# Patient Record
Sex: Female | Born: 1990 | Race: Black or African American | Hispanic: No | Marital: Single | Smoking: Never smoker
Health system: Southern US, Community
[De-identification: ages and names within clinical notes are randomized; demographics above are authoritative.]

---

## 2010-03-31 ENCOUNTER — Encounter: Admission: RE | Admit: 2010-03-31 | Discharge: 2010-03-31 | Payer: Self-pay | Admitting: Internal Medicine

## 2011-10-23 ENCOUNTER — Emergency Department (HOSPITAL_COMMUNITY)
Admission: EM | Admit: 2011-10-23 | Discharge: 2011-10-24 | Disposition: A | Discharge status: Home / Self Care | Attending: Emergency Medicine | Admitting: Emergency Medicine

## 2011-10-23 ENCOUNTER — Encounter (HOSPITAL_COMMUNITY): Payer: Self-pay | Admitting: *Deleted

## 2011-10-23 DIAGNOSIS — N390 Urinary tract infection, site not specified: Secondary | ICD-10-CM

## 2011-10-23 DIAGNOSIS — N72 Inflammatory disease of cervix uteri: Secondary | ICD-10-CM | POA: Insufficient documentation

## 2011-10-23 DIAGNOSIS — R300 Dysuria: Secondary | ICD-10-CM | POA: Insufficient documentation

## 2011-10-23 DIAGNOSIS — R350 Frequency of micturition: Secondary | ICD-10-CM | POA: Insufficient documentation

## 2011-10-23 DIAGNOSIS — N898 Other specified noninflammatory disorders of vagina: Secondary | ICD-10-CM | POA: Insufficient documentation

## 2011-10-23 LAB — POCT PREGNANCY, URINE: Preg Test, Ur: NEGATIVE

## 2011-10-23 NOTE — ED Notes (Signed)
She has had painful urination and some blood in her urine for the past 2 hours.  lmp  2months

## 2011-10-24 LAB — WET PREP, GENITAL
Clue Cells Wet Prep HPF POC: NONE SEEN
Trich, Wet Prep: NONE SEEN
Yeast Wet Prep HPF POC: NONE SEEN

## 2011-10-24 LAB — URINALYSIS, ROUTINE W REFLEX MICROSCOPIC
Bilirubin Urine: NEGATIVE
Protein, ur: NEGATIVE mg/dL
Urobilinogen, UA: 0.2 mg/dL (ref 0.0–1.0)
pH: 7 (ref 5.0–8.0)

## 2011-10-24 LAB — URINE MICROSCOPIC-ADD ON

## 2011-10-24 MED ORDER — PHENAZOPYRIDINE HCL 200 MG PO TABS: 200.0000 mg | ORAL_TABLET | Freq: Three times a day (TID) | ORAL | Status: AC

## 2011-10-24 MED ORDER — LIDOCAINE HCL (PF) 1 % IJ SOLN
INTRAMUSCULAR | Status: AC
  Administered 2011-10-24: 03:00:00
  Filled 2011-10-24: qty 5

## 2011-10-24 MED ORDER — AZITHROMYCIN 250 MG PO TABS
1000.0000 mg | ORAL_TABLET | Freq: Once | ORAL | Status: AC
  Administered 2011-10-24: 1000 mg via ORAL
  Filled 2011-10-24: qty 4

## 2011-10-24 MED ORDER — CEFTRIAXONE SODIUM 250 MG IJ SOLR
250.0000 mg | Freq: Once | INTRAMUSCULAR | Status: AC
  Administered 2011-10-24: 250 mg via INTRAMUSCULAR
  Filled 2011-10-24: qty 250

## 2011-10-24 MED ORDER — NITROFURANTOIN MONOHYD MACRO 100 MG PO CAPS: 100.0000 mg | ORAL_CAPSULE | Freq: Two times a day (BID) | ORAL | Status: AC

## 2011-10-24 NOTE — ED Provider Notes (Signed)
Medical screening examination/treatment/procedure(s) were performed by non-physician practitioner and as supervising physician I was immediately available for consultation/collaboration.   Joya Gaskins, MD 10/24/11 [phone]

## 2011-10-24 NOTE — ED Provider Notes (Signed)
History     CSN: [id number]  Arrival date & time 10/23/11  2306   First MD Initiated Contact with Patient 10/24/11 0116      Chief Complaint  Patient presents with  . poss uti      Patient is a 21 y.o. female presenting with dysuria.  Dysuria  This is a new problem. The current episode started 3 to 5 hours ago. The problem occurs every urination. The problem has been gradually worsening. The quality of the pain is described as burning. The pain is at a severity of 8/10. There has been no fever. She is sexually active. There is no history of pyelonephritis. Associated symptoms include frequency. Pertinent negatives include no chills, no nausea, no vomiting, no discharge and no flank pain. She has tried nothing for the symptoms. Her past medical history does not include kidney stones.   patient portable onset of dysuria and frequency of urination approximately 4 hours ago. States she also saw a spot of blood on the tissue after a void. Patient denies recent unprotected intercourse, fever, vaginal discharge, lower abd pain, flank pain or other associated symptoms. Patient states her LMP was one half months ago. She was taking OCs but is not at this time.  History reviewed. No pertinent past medical history.  History reviewed. No pertinent past surgical history.  History reviewed. No pertinent family history.  History  Substance Use Topics  . Smoking status: Never Smoker   . Smokeless tobacco: Not on file  . Alcohol Use: Yes    OB History    Grav Para Term Preterm Abortions TAB SAB Ect Mult Living                  Review of Systems  Constitutional: Negative.  Negative for chills.  HENT: Negative.   Eyes: Negative.   Respiratory: Negative.   Cardiovascular: Negative.   Gastrointestinal: Negative.  Negative for nausea and vomiting.  Genitourinary: Positive for dysuria and frequency. Negative for flank pain.  Musculoskeletal: Negative.   Skin: Negative.   Neurological:  Negative.   Hematological: Negative.   Psychiatric/Behavioral: Negative.     Allergies  Review of patient's allergies indicates no known allergies.  Home Medications   Current Outpatient Rx  Name Route Sig Dispense Refill  . BIOTIN PO Oral Take 1 tablet by mouth 3 (three) times daily.    Marland Kitchen NITROFURANTOIN MONOHYD MACRO 100 MG PO CAPS Oral Take 1 capsule (100 mg total) by mouth 2 (two) times daily. 14 capsule 0  . PHENAZOPYRIDINE HCL 200 MG PO TABS Oral Take 1 tablet (200 mg total) by mouth 3 (three) times daily. 6 tablet 0    BP 126/76  Pulse 90  Temp(Src) 98.9 F (37.2 C) (Oral)  Resp 20  SpO2 100%  LMP 08/25/2011  Physical Exam  Constitutional: She is oriented to person, place, and time. She appears well-developed and well-nourished.  HENT:  Head: Normocephalic and atraumatic.  Eyes: Conjunctivae are normal.  Neck: Neck supple.  Cardiovascular: Normal rate and regular rhythm.   Pulmonary/Chest: Effort normal and breath sounds normal.  Abdominal: Soft. Bowel sounds are normal. Hernia confirmed negative in the right inguinal area and confirmed negative in the left inguinal area.  Genitourinary: Uterus normal. Pelvic exam was performed with patient supine. There is no rash, tenderness or lesion on the right labia. There is no rash, tenderness or lesion on the left labia. Cervix exhibits motion tenderness, discharge and friability. Right adnexum displays no mass, no tenderness  and no fullness. Left adnexum displays no mass, no tenderness and no fullness. Vaginal discharge found.  Musculoskeletal: Normal range of motion.  Lymphadenopathy:       Right: No inguinal adenopathy present.       Left: No inguinal adenopathy present.  Neurological: She is alert and oriented to person, place, and time.  Skin: Skin is warm and dry. No erythema.  Psychiatric: She has a normal mood and affect.    ED Course  Pelvic exam Date/Time: 10/24/2011 2:20 AM Performed by: Leanne Chang Authorized by: Leanne Chang Consent: Verbal consent obtained. Risks and benefits: risks, benefits and alternatives were discussed Consent given by: patient Patient understanding: patient states understanding of the procedure being performed Required items: required blood products, implants, devices, and special equipment available Patient identity confirmed: verbally with patient and arm band Local anesthesia used: no Patient sedated: no Patient tolerance: Patient tolerated the procedure well with no immediate complications.   findings and clinical impression discussed with patient. Patient treated here for cervicitis. Will treat UTI as patient is symptomatic. Would recommend follow up with her PCP at [facility] in 10 days to 2 weeks for repeat UA and pelvic exam. Patient agreeable with plan.  Labs Reviewed  URINALYSIS, ROUTINE W REFLEX MICROSCOPIC - Abnormal; Notable for the following:    Hgb urine dipstick LARGE (*)    Leukocytes, UA LARGE (*)    All other components within normal limits  URINE MICROSCOPIC-ADD ON - Abnormal; Notable for the following:    Bacteria, UA FEW (*)    All other components within normal limits  WET PREP, GENITAL - Abnormal; Notable for the following:    WBC, Wet Prep HPF POC TOO NUMEROUS TO COUNT (*)    All other components within normal limits  POCT PREGNANCY, URINE  GC/CHLAMYDIA PROBE AMP, GENITAL   No results found.   1. Urinary tract infection   2. Cervicitis       MDM  HPI/PE and clinical findings c/w 1. Urinary tract infection 2. Cervicitis        Leanne Chang, NP 10/24/11 [phone]

## 2011-10-24 NOTE — Discharge Instructions (Signed)
Please review the instructions below. You were treated tonight in the emergency department for your urinary symptoms. Your exam shows that you have a cervicitis as well. We have treated your cervicitis with antibiotics here. You will continue an antibiotic at home for a week for urinary tract infection. We have also prescribed a short course of the medication to help with your painful urination. You should not have intercourse for approximately 10 days to 2 weeks while you're infection clears. When you do resume sexual activity you should use condoms. Ideally you should follow up with your primary care provider at [facility] to have a recheck of your urine and also a repeat pelvic exam to assure that your infections have cleared. The Gonorrhea and Chlamydia cultures we obtained tonight will result in 2 days. If one or both are positive you will be notified by phone. Return if you have any worsening symptoms otherwise follow up as discussed.     Cervicitis Cervicitis is a soreness and puffiness (inflammation) of the cervix. The cervix is at the bottom of the uterus. Your doctor will do an exam to find the cause. Your treatment will depend on the cause. If the cause is a sexual infection, you and your partner both need treatment. HOME CARE  Do not have sex (intercourse) until your doctor says it is okay.   Do not have sex until your partner is treated if your doctor told you not to.   Take medicines (antibiotics) as told. Finish them even if you start to feel better.  GET HELP RIGHT AWAY IF:   Your problems come back.   You have a fever.   You have problems that may be caused by your medicine.  MAKE SURE YOU:   Understand these instructions.   Will watch your condition.   Will get help right away if you are not doing well or get worse.  Document Released: 05/05/2008 Document Revised: 07/16/2011 Document Reviewed: 02/23/2011 [facility] Patient Information 2012 [employer],  [place].Urinary Tract Infection A urinary tract infection (UTI) is often caused by a germ (bacteria). A UTI is usually helped with medicine (antibiotics) that kills germs. Take all the medicine until it is gone. Do this even if you are feeling better. You are usually better in 7 to 10 days. HOME CARE   Drink enough water and fluids to keep your pee (urine) clear or pale yellow. Drink:   Cranberry juice.   Water.   Avoid:   Caffeine.   Tea.   Bubbly (carbonated) drinks.   Alcohol.   Only take medicine as told by your doctor.   To prevent further infections:   Pee often.   After pooping (bowel movement), women should wipe from front to back. Use each tissue only once.   Pee before and after having sex (intercourse).  Ask your doctor when your test results will be ready. Make sure you follow up and get your test results.  GET HELP RIGHT AWAY IF:   There is very bad back pain or lower belly (abdominal) pain.   You get the chills.   You have a fever.   Your baby is older than 3 months with a rectal temperature of 102 F (38.9 C) or higher.   Your baby is 27 months old or younger with a rectal temperature of 100.4 F (38 C) or higher.   You feel sick to your stomach (nauseous) or throw up (vomit).   There is continued burning with peeing.  Your problems are not better in 3 days. Return sooner if you are getting worse.  MAKE SURE YOU:   Understand these instructions.   Will watch your condition.   Will get help right away if you are not doing well or get worse.  Document Released: 01/13/2008 Document Revised: 07/16/2011 Document Reviewed: 01/13/2008

## 2011-10-24 NOTE — ED Notes (Signed)
Pt requested to go over d/c instructions while in CDU 11; sign pad not working; ED NP and myself went through diagnosis and d/c instructions thoroughly and answered pt's questions; pt verbalized understanding; prescriptions for pyridium and macrobid attached to d/c instructions, and instructed pt to complete antibiotic for the whole duration prescribed

## 2011-10-24 NOTE — ED Notes (Signed)
While K. Schorr performed pelvic, pt requested to have information given to her privately; pt brought to CDU room to speak with NP and receive medicines

## 2011-10-26 LAB — GC/CHLAMYDIA PROBE AMP, GENITAL
Chlamydia, DNA Probe: NEGATIVE
GC Probe Amp, Genital: NEGATIVE

## 2013-10-27 ENCOUNTER — Emergency Department (HOSPITAL_COMMUNITY)
Admission: EM | Admit: 2013-10-27 | Discharge: 2013-10-27 | Disposition: A | Discharge status: Home / Self Care | Attending: Emergency Medicine | Admitting: Emergency Medicine

## 2013-10-27 ENCOUNTER — Encounter (HOSPITAL_COMMUNITY): Payer: Self-pay | Admitting: Emergency Medicine

## 2013-10-27 ENCOUNTER — Emergency Department (INDEPENDENT_AMBULATORY_CARE_PROVIDER_SITE_OTHER)
Admission: EM | Admit: 2013-10-27 | Discharge: 2013-10-27 | Disposition: A | Discharge status: Home / Self Care | Source: Home / Self Care | Attending: Family Medicine | Admitting: Family Medicine

## 2013-10-27 DIAGNOSIS — M79605 Pain in left leg: Secondary | ICD-10-CM

## 2013-10-27 DIAGNOSIS — M79609 Pain in unspecified limb: Secondary | ICD-10-CM

## 2013-10-27 DIAGNOSIS — M79662 Pain in left lower leg: Secondary | ICD-10-CM

## 2013-10-27 NOTE — Discharge Instructions (Signed)
Pain of Unknown Etiology (Pain Without a Known Cause) °You have come to your caregiver because of pain. Pain can occur in any part of the body. Often there is not a definite cause. If your laboratory (blood or urine) work was normal and X-rays or other studies were normal, your caregiver may treat you without knowing the cause of the pain. An example of this is the headache. Most headaches are diagnosed by taking a history. This means your caregiver asks you questions about your headaches. Your caregiver determines a treatment based on your answers. Usually testing done for headaches is normal. Often testing is not done unless there is no response to medications. Regardless of where your pain is located today, you can be given medications to make you comfortable. If no physical cause of pain can be found, most cases of pain will gradually leave as suddenly as they came.  °If you have a painful condition and no reason can be found for the pain, it is important that you follow up with your caregiver. If the pain becomes worse or does not go away, it may be necessary to repeat tests and look further for a possible cause. °· Only take over-the-counter or prescription medicines for pain, discomfort, or fever as directed by your caregiver. °· For the protection of your privacy, test results cannot be given over the phone. Make sure you receive the results of your test. Ask how these results are to be obtained if you have not been informed. It is your responsibility to obtain your test results. °· You may continue all activities unless the activities cause more pain. When the pain lessens, it is important to gradually resume normal activities. Resume activities by beginning slowly and gradually increasing the intensity and duration of the activities or exercise. During periods of severe pain, bed rest may be helpful. Lie or sit in any position that is comfortable. °· Ice used for acute (sudden) conditions may be effective.  Use a large plastic bag filled with ice and wrapped in a towel. This may provide pain relief. °· See your caregiver for continued problems. Your caregiver can help or refer you for exercises or physical therapy if necessary. °If you were given medications for your condition, do not drive, operate machinery or power tools, or sign legal documents for 24 hours. Do not drink alcohol, take sleeping pills, or take other medications that may interfere with treatment. °See your caregiver immediately if you have pain that is becoming worse and not relieved by medications. °Document Released: 04/21/2001 Document Revised: 05/17/2013 Document Reviewed: 07/27/2005 °[employer]® Patient Information ©2014 [employer]. ° °Musculoskeletal Pain °Musculoskeletal pain is muscle and boney aches and pains. These pains can occur in any part of the body. Your caregiver may treat you without knowing the cause of the pain. They may treat you if blood or urine tests, X-rays, and other tests were normal.  °CAUSES °There is often not a definite cause or reason for these pains. These pains may be caused by a type of germ (virus). The discomfort may also come from overuse. Overuse includes working out too hard when your body is not fit. Boney aches also come from weather changes. Bone is sensitive to atmospheric pressure changes. °HOME CARE INSTRUCTIONS  °· Ask when your test results will be ready. Make sure you get your test results. °· Only take over-the-counter or prescription medicines for pain, discomfort, or fever as directed by your caregiver. If you were given medications for your condition,   do not drive, operate machinery or power tools, or sign legal documents for 24 hours. Do not drink alcohol. Do not take sleeping pills or other medications that may interfere with treatment. °· Continue all activities unless the activities cause more pain. When the pain lessens, slowly resume normal activities. Gradually increase the intensity and  duration of the activities or exercise. °· During periods of severe pain, bed rest may be helpful. Lay or sit in any position that is comfortable. °· Putting ice on the injured area. °· Put ice in a bag. °· Place a towel between your skin and the bag. °· Leave the ice on for 15 to 20 minutes, 3 to 4 times a day. °· Follow up with your caregiver for continued problems and no reason can be found for the pain. If the pain becomes worse or does not go away, it may be necessary to repeat tests or do additional testing. Your caregiver may need to look further for a possible cause. °SEEK IMMEDIATE MEDICAL CARE IF: °· You have pain that is getting worse and is not relieved by medications. °· You develop chest pain that is associated with shortness or breath, sweating, feeling sick to your stomach (nauseous), or throw up (vomit). °· Your pain becomes localized to the abdomen. °· You develop any new symptoms that seem different or that concern you. °MAKE SURE YOU:  °· Understand these instructions. °· Will watch your condition. °· Will get help right away if you are not doing well or get worse. °Document Released: 07/27/2005 Document Revised: 10/19/2011 Document Reviewed: 03/31/2013 °Exit[employer]® Patient Information ©2014 [employer]. ° °

## 2013-10-27 NOTE — ED Notes (Signed)
Leg pain , no known trauma. Pain started ~ 2 weeks ago, when had a 4 hour flight (and 4 h back) pain in left foot , then developed pain in calf. Minimal relief w OTC medications

## 2013-10-27 NOTE — ED Notes (Signed)
The pt is c/o lt calf pain for 4-6 days.  She has some swelling in her lt knee.  She has been on an airplane trip  7 hours in the past 2 weeks.  [facility]

## 2013-10-27 NOTE — ED Notes (Signed)
Pt discussed with dr Wilkie Ayehorton us ordered

## 2013-10-27 NOTE — Progress Notes (Signed)
*  PRELIMINARY RESULTS* Vascular Ultrasound Left lower extremity venous duplex has been completed.  Preliminary findings: Left:  No evidence of DVT, superficial thrombosis, or Baker's cyst.   Farrel DemarkJill Eunice, RDMS, RVT  10/27/2013, 4:21 PM

## 2013-10-27 NOTE — ED Provider Notes (Signed)
CSN: [id number]     Arrival date & time 10/27/13  1523 History  This chart was scribed for non-physician practitioner, Junious SilkHannah Stclair Szymborski, PA-C,working with Shon Batonourtney F Horton, MD, by Karle PlumberJennifer Tensley, ED Scribe.  This patient was seen in room TR11C/TR11C and the patient's care was started at 6:18 PM.  Chief Complaint  Patient presents with  . calf    The history is provided by the patient. No language interpreter was used.   HPI Comments:  Paula Prince is a 23 y.o. female who presents to the Emergency Department complaining of intermittent left calf pain that started approximately one week ago. Pt states she cannot describe the pain and states it just feels "weird". She states she had a sharp pain in the bottom of her left foot about one week ago that went away. She states she then began to have left knee pain that went away. Pt then states that the "weird" feeling began in her calf. She says nothing makes the feeling better or worse. She states she has taken Aleve for the feeling with no relief. She states she traveled on an airplane about two weeks ago that lasted approximately 7 hours. Pt denies numbness or tingling of the leg.  History reviewed. No pertinent past medical history. History reviewed. No pertinent past surgical history. No family history on file. History  Substance Use Topics  . Smoking status: Never Smoker   . Smokeless tobacco: Not on file  . Alcohol Use: Yes   OB History   Grav Para Term Preterm Abortions TAB SAB Ect Mult Living                 Review of Systems  Constitutional: Negative for fever and chills.  Respiratory: Negative for shortness of breath.   Cardiovascular: Negative for chest pain.  Musculoskeletal: Positive for myalgias.  Skin: Negative for color change.  Neurological: Negative for numbness.  All other systems reviewed and are negative.    Allergies  Review of patient's allergies indicates no known allergies.  Home Medications   Current  Outpatient Rx  Name  Route  Sig  Dispense  Refill  . naproxen sodium (ALEVE) 220 MG tablet   Oral   Take 220-440 mg by mouth 2 (two) times daily as needed (pain).          Triage Vitals: BP 127/75  Pulse 93  Temp(Src) 99 F (37.2 C) (Oral)  Resp 20  Wt 132 lb 8 oz (60.102 kg)  SpO2 100% Physical Exam  Nursing note and vitals reviewed. Constitutional: She is oriented to person, place, and time. She appears well-developed and well-nourished. No distress.  HENT:  Head: Normocephalic and atraumatic.  Right Ear: External ear normal.  Left Ear: External ear normal.  Nose: Nose normal.  Mouth/Throat: Oropharynx is clear and moist.  Eyes: Conjunctivae are normal.  Neck: Normal range of motion.  Cardiovascular: Normal rate, regular rhythm, normal heart sounds, intact distal pulses and normal pulses.   Pulses:      Posterior tibial pulses are 2+ on the right side, and 2+ on the left side.  Pulmonary/Chest: Effort normal and breath sounds normal. No stridor. No respiratory distress. She has no wheezes. She has no rales.  Abdominal: Soft. She exhibits no distension.  Musculoskeletal: Normal range of motion.  Strength in BLE tested against resistance, 5/5. Dorsi and plantar flexion do not increase pain. Flexion and extension of hip do not increase pain. Log roll test neg bilaterally.   Neurological: She is  alert and oriented to person, place, and time. She has normal strength.  Strength 5/5 BLE. Neurovascularly intact. Compartments soft.  Skin: Skin is warm and dry. She is not diaphoretic. No erythema.  No ecchymosis, fluctuance, erythema, induration, or streaking or sign of infection.   Psychiatric: She has a normal mood and affect. Her behavior is normal.    ED Course  Procedures (including critical care time) DIAGNOSTIC STUDIES: Oxygen Saturation is 100% on RA, normal by my interpretation.   COORDINATION OF CARE: 6:25PM- Informed pt of negative doppler and advised pt to follow  up with PCP if symptoms persist. Pt verbalizes understanding and agrees to plan.  Medications - No data to display  Labs Review Labs Reviewed - No data to display Imaging Review No results found.   EKG Interpretation None      MDM   Final diagnoses:  Left leg pain   Patient presents to ED from [facility]. She has transient left lower leg pain after a long flight. Sent to r/o DVT. Doppler imaging negative for DVT. Patient with benign exam. Neuro exam without deficit. No signs of infection. Strength 5/5. Patient discharged home with instruction to f/u with PCP if this pain persists. No additional imaging at this time.   I personally performed the services described in this documentation, which was scribed in my presence. The recorded information has been reviewed and is accurate.    Mora Bellman, PA-C 10/28/13 [phone]

## 2013-10-27 NOTE — ED Provider Notes (Signed)
CSN: [id number]     Arrival date & time 10/27/13  1301 History   First MD Initiated Contact with Patient 10/27/13 1426     Chief Complaint  Patient presents with  . Leg Pain   (Consider location/radiation/quality/duration/timing/severity/associated sxs/prior Treatment) HPI Comments: Patient developed progressive pain and swelling of left foot, left ankle (x 2 weeks) and left calf pain (x 1 week) following [place] [facility] [place] [facility] [place] [place] two weeks ago. No reported recent injury. States left lower leg has become too uncomfortable to wear socks. Denies previous episodes of same. Denies fever, chest pain or dyspnea.  No swelling of right lower extremity.   Patient is a 23 y.o. female presenting with leg pain. The history is provided by the patient.  Leg Pain   History reviewed. No pertinent past medical history. History reviewed. No pertinent past surgical history. History reviewed. No pertinent family history. History  Substance Use Topics  . Smoking status: Never Smoker   . Smokeless tobacco: Not on file  . Alcohol Use: Yes   OB History   Grav Para Term Preterm Abortions TAB SAB Ect Mult Living                 Review of Systems  All other systems reviewed and are negative.    Allergies  Review of patient's allergies indicates no known allergies.  Home Medications   Current Outpatient Rx  Name  Route  Sig  Dispense  Refill  . naproxen sodium (ALEVE) 220 MG tablet   Oral   Take 220-440 mg by mouth 2 (two) times daily as needed (pain).          BP 120/74  Pulse 77  Temp(Src) 98.8 F (37.1 C) (Oral)  Resp 12  SpO2 100% Physical Exam  Nursing note and vitals reviewed. Constitutional: She is oriented to person, place, and time. She appears well-developed and well-nourished.  HENT:  Head: Normocephalic and atraumatic.  Eyes: Conjunctivae are normal. No scleral icterus.  Cardiovascular: Normal rate, regular rhythm and normal heart sounds.   Pulmonary/Chest: Effort  normal and breath sounds normal.  Musculoskeletal: Normal range of motion.       Left lower leg: She exhibits tenderness. She exhibits no bony tenderness, no swelling, no edema, no deformity and no laceration.       Legs: Area outlined on diagram is area of discomfort with ROM of left foot or palpation. Distal pulses and CSM exam of LLE intact  Neurological: She is alert and oriented to person, place, and time.  Skin: Skin is warm and dry.  Psychiatric: She has a normal mood and affect. Her behavior is normal.    ED Course  Procedures (including critical care time) Labs Review Labs Reviewed - No data to display Imaging Review No results found.   MDM   1. Pain of left calf    Patient is concerned that she may have a LLE DVT. No reports of chest pain, pleuritic chest pain or dyspnea. No known coagulopathy or OCP use. Limited improvement of left calf pain with OTC meds. [facility]LE [facility]/S.   Ardis RowanJennifer Lee Evaline Waltman, PA 10/28/13 1348

## 2013-10-28 NOTE — ED Provider Notes (Signed)
Medical screening examination/treatment/procedure(s) were performed by non-physician practitioner and as supervising physician I was immediately available for consultation/collaboration.   EKG Interpretation None        Courtney F Horton, MD 10/28/13 1459 

## 2013-10-29 NOTE — ED Provider Notes (Signed)
Medical screening examination/treatment/procedure(s) were performed by resident physician or non-physician practitioner and as supervising physician I was immediately available for consultation/collaboration.   KINDL,JAMES DOUGLAS MD.   James D Kindl, MD 10/29/13 1021 

## 2014-01-25 ENCOUNTER — Ambulatory Visit (INDEPENDENT_AMBULATORY_CARE_PROVIDER_SITE_OTHER): Admitting: Family Medicine

## 2014-01-25 VITALS — BP 110/68 | HR 88 | Temp 98.2°F | Resp 16 | Ht 64.25 in | Wt 130.0 lb

## 2014-01-25 DIAGNOSIS — N760 Acute vaginitis: Secondary | ICD-10-CM

## 2014-01-25 DIAGNOSIS — A499 Bacterial infection, unspecified: Secondary | ICD-10-CM

## 2014-01-25 DIAGNOSIS — B9689 Other specified bacterial agents as the cause of diseases classified elsewhere: Secondary | ICD-10-CM

## 2014-01-25 DIAGNOSIS — N898 Other specified noninflammatory disorders of vagina: Secondary | ICD-10-CM

## 2014-01-25 LAB — POCT WET PREP WITH KOH
KOH Prep POC: NEGATIVE
Trichomonas, UA: NEGATIVE
YEAST WET PREP PER HPF POC: NEGATIVE

## 2014-01-25 MED ORDER — METRONIDAZOLE 500 MG PO TABS: 500.0000 mg | ORAL_TABLET | Freq: Two times a day (BID) | ORAL | Status: AC

## 2014-01-25 NOTE — Patient Instructions (Signed)
Bacterial Vaginosis Bacterial vaginosis is a vaginal infection that occurs when the normal balance of bacteria in the vagina is disrupted. It results from an overgrowth of certain bacteria. This is the most common vaginal infection in women of childbearing age. Treatment is important to prevent complications, especially in pregnant women, as it can cause a premature delivery. CAUSES  Bacterial vaginosis is caused by an increase in harmful bacteria that are normally present in smaller amounts in the vagina. Several different kinds of bacteria can cause bacterial vaginosis. However, the reason that the condition develops is not fully understood. RISK FACTORS Certain activities or behaviors can put you at an increased risk of developing bacterial vaginosis, including:  Having a new sex partner or multiple sex partners.  Douching.  Using an intrauterine device (IUD) for contraception. Women do not get bacterial vaginosis from toilet seats, bedding, swimming pools, or contact with objects around them. SIGNS AND SYMPTOMS  Some women with bacterial vaginosis have no signs or symptoms. Common symptoms include:  Grey vaginal discharge.  A fishlike odor with discharge, especially after sexual intercourse.  Itching or burning of the vagina and vulva.  Burning or pain with urination. DIAGNOSIS  Your health care provider will take a medical history and examine the vagina for signs of bacterial vaginosis. A sample of vaginal fluid may be taken. Your health care provider will look at this sample under a microscope to check for bacteria and abnormal cells. A vaginal pH test may also be done.  TREATMENT  Bacterial vaginosis may be treated with antibiotic medicines. These may be given in the form of a pill or a vaginal cream. A second round of antibiotics may be prescribed if the condition comes back after treatment.  HOME CARE INSTRUCTIONS   Only take over-the-counter or prescription medicines as  directed by your health care provider.  If antibiotic medicine was prescribed, take it as directed. Make sure you finish it even if you start to feel better.  Do not have sex until treatment is completed.  Tell all sexual partners that you have a vaginal infection. They should see their health care provider and be treated if they have problems, such as a mild rash or itching.  Practice safe sex by using condoms and only having one sex partner. SEEK MEDICAL CARE IF:   Your symptoms are not improving after 3 days of treatment.  You have increased discharge or pain.  You have a fever. MAKE SURE YOU:   Understand these instructions.  Will watch your condition.  Will get help right away if you are not doing well or get worse. FOR MORE INFORMATION  Centers for Disease Control and Prevention, Division of STD Prevention: www.cdc.gov/std American Sexual Health Association (ASHA): www.ashastd.org  Document Released: 07/27/2005 Document Revised: 05/17/2013 Document Reviewed: 03/08/2013 [employer] Patient Information 2015 [employer]. This information is not intended to replace advice given to you by your health care provider. Make sure you discuss any questions you have with your health care provider.  

## 2014-01-25 NOTE — Progress Notes (Signed)
Subjective:    Patient ID: Paula Prince, female    DOB: 11/01/1990, 23 y.o.   MRN: [id number]  This chart was scribed for Paula Julyva N. Clelia CroftShaw, MD by Chestine SporeSoijett Blue, ED Scribe. The patient was seen in room 4 at 2:04 PM.   Chief Complaint  Patient presents with  . Vaginitis    odor  x 3 days     HPI  Paula Prince is a 23 y.o. female who presents today complaining of vaginal odor onset 3 days ago. She states that there is a very small amount of discharge that is thin and very light in color. She states that the smell is very prominent and is what made her realize she needed it to be checked. She states that she has had something similar to this before in the past. Gets BV just about 1-2x/yr. She states that she noticed the change after she used her moms shower gel. She denies vaginal pain, vaginal itching, nausea, vomiting, and diarrhea. She states that she has not had urinary or bowel incontinence and both are normal. She states that she is not currently sexually active.      History reviewed. No pertinent past medical history.  No current outpatient prescriptions on file prior to visit.   No current facility-administered medications on file prior to visit.    No Known Allergies   Review of Systems  Constitutional: Negative for chills and fatigue.  Gastrointestinal: Negative for nausea, vomiting, abdominal pain and diarrhea.  Genitourinary: Positive for vaginal discharge. Negative for dysuria, frequency, vaginal bleeding, difficulty urinating, genital sores, vaginal pain, menstrual problem and pelvic pain.  Skin: Negative for rash.  Hematological: Negative for adenopathy.      BP 110/68  Pulse 88  Temp(Src) 98.2 F (36.8 C) (Oral)  Resp 16  Ht 5' 4.25" (1.632 m)  Wt 130 lb (58.968 kg)  BMI 22.14 kg/m2  SpO2 99%  LMP 01/18/2014  Objective:   Physical Exam  Nursing note and vitals reviewed. Constitutional: She is oriented to person, place, and time. She appears  well-developed and well-nourished. No distress.  HENT:  Head: Normocephalic and atraumatic.  Eyes: EOM are normal.  Neck: Neck supple. No tracheal deviation present.  Cardiovascular: Normal rate, regular rhythm and normal heart sounds.   Pulmonary/Chest: Effort normal and breath sounds normal. No respiratory distress.  Abdominal: Soft. Bowel sounds are normal. She exhibits no distension and no mass. There is no tenderness. There is no CVA tenderness.  Genitourinary: There is no rash, tenderness or lesion on the right labia. There is no rash, tenderness or lesion on the left labia. Cervix exhibits no motion tenderness and no friability. No erythema around the vagina. Vaginal discharge found.  Moderate amount of thin white discharge.  Musculoskeletal: Normal range of motion.  Neurological: She is alert and oriented to person, place, and time.  Skin: Skin is warm and dry.  Psychiatric: She has a normal mood and affect. Her behavior is normal.   Results for orders placed in visit on 01/25/14  POCT WET PREP WITH KOH      Result Value Ref Range   Trichomonas, UA Negative     Clue Cells Wet Prep HPF POC 50%     Epithelial Wet Prep HPF POC 5-15     Yeast Wet Prep HPF POC neg     Bacteria Wet Prep HPF POC 2+     RBC Wet Prep HPF POC 0-1     WBC Wet Prep HPF POC  5-10     KOH Prep POC Negative        Assessment & Plan:  COORDINATION OF CARE: 2:10 PM-Discussed treatment plan with pt at bedside and pt agreed to plan.   Vaginal discharge - Plan: POCT Wet Prep with KOH  Bacterial vaginosis  Meds ordered this encounter  Medications  . metroNIDAZOLE (FLAGYL) 500 MG tablet    Sig: Take 1 tablet (500 mg total) by mouth 2 (two) times daily.    Dispense:  14 tablet    Refill:  0    I personally performed the services described in this documentation, which was scribed in my presence. The recorded information has been reviewed and considered, and addended by me as needed.  Norberto SorensonEva Shaw, MD MPH

## 2014-02-02 ENCOUNTER — Encounter (HOSPITAL_COMMUNITY): Payer: Self-pay | Admitting: Emergency Medicine

## 2014-02-02 ENCOUNTER — Emergency Department (HOSPITAL_COMMUNITY)

## 2014-02-02 ENCOUNTER — Emergency Department (HOSPITAL_COMMUNITY)
Admission: EM | Admit: 2014-02-02 | Discharge: 2014-02-02 | Disposition: A | Discharge status: Home / Self Care | Attending: Emergency Medicine | Admitting: Emergency Medicine

## 2014-02-02 DIAGNOSIS — Z792 Long term (current) use of antibiotics: Secondary | ICD-10-CM | POA: Insufficient documentation

## 2014-02-02 DIAGNOSIS — R002 Palpitations: Secondary | ICD-10-CM | POA: Insufficient documentation

## 2014-02-02 DIAGNOSIS — F430 Acute stress reaction: Secondary | ICD-10-CM | POA: Insufficient documentation

## 2014-02-02 DIAGNOSIS — R0789 Other chest pain: Secondary | ICD-10-CM

## 2014-02-02 DIAGNOSIS — R000 Tachycardia, unspecified: Secondary | ICD-10-CM | POA: Insufficient documentation

## 2014-02-02 DIAGNOSIS — Z3202 Encounter for pregnancy test, result negative: Secondary | ICD-10-CM | POA: Insufficient documentation

## 2014-02-02 DIAGNOSIS — F439 Reaction to severe stress, unspecified: Secondary | ICD-10-CM

## 2014-02-02 DIAGNOSIS — R0602 Shortness of breath: Secondary | ICD-10-CM | POA: Insufficient documentation

## 2014-02-02 LAB — BASIC METABOLIC PANEL
BUN: 6 mg/dL (ref 6–23)
CO2: 24 mEq/L (ref 19–32)
Calcium: 9.3 mg/dL (ref 8.4–10.5)
Chloride: 101 mEq/L (ref 96–112)
Creatinine, Ser: 0.69 mg/dL (ref 0.50–1.10)
GFR calc non Af Amer: >90 mL/min (ref >90)
Glucose, Bld: 117 mg/dL — ABNORMAL HIGH (ref 70–99)
POTASSIUM: 3.9 meq/L (ref 3.7–5.3)
SODIUM: 137 meq/L (ref 137–147)

## 2014-02-02 LAB — I-STAT TROPONIN, ED: Troponin i, poc: 0 ng/mL (ref 0.00–0.08)

## 2014-02-02 LAB — CBC
HEMATOCRIT: 37.4 % (ref 36.0–46.0)
Hemoglobin: 12.9 g/dL (ref 12.0–15.0)
MCH: 30.6 pg (ref 26.0–34.0)
MCHC: 34.5 g/dL (ref 30.0–36.0)
MCV: 88.6 fL (ref 78.0–100.0)
PLATELETS: 303 10*3/uL (ref 150–400)
RBC: 4.22 MIL/uL (ref 3.87–5.11)
RDW: 12.7 % (ref 11.5–15.5)
WBC: 6.8 10*3/uL (ref 4.0–10.5)

## 2014-02-02 LAB — POC URINE PREG, ED: Preg Test, Ur: NEGATIVE

## 2014-02-02 LAB — PRO B NATRIURETIC PEPTIDE: PRO B NATRI PEPTIDE: 34.4 pg/mL (ref 0–125)

## 2014-02-02 LAB — D-DIMER, QUANTITATIVE (NOT AT ARMC)

## 2014-02-02 NOTE — Discharge Instructions (Signed)
Chest Pain (Nonspecific) It is often hard to give a specific diagnosis for the cause of chest pain. There is always a chance that your pain could be related to something serious, such as a heart attack or a blood clot in the lungs. You need to follow up with your health care provider for further evaluation. CAUSES   Heartburn.  Pneumonia or bronchitis.  Anxiety or stress.  Inflammation around your heart (pericarditis) or lung (pleuritis or pleurisy).  A blood clot in the lung.  A collapsed lung (pneumothorax). It can develop suddenly on its own (spontaneous pneumothorax) or from trauma to the chest.  Shingles infection (herpes zoster virus). The chest wall is composed of bones, muscles, and cartilage. Any of these can be the source of the pain.  The bones can be bruised by injury.  The muscles or cartilage can be strained by coughing or overwork.  The cartilage can be affected by inflammation and become sore (costochondritis). DIAGNOSIS  Lab tests or other studies may be needed to find the cause of your pain. Your health care provider may have you take a test called an ambulatory electrocardiogram (ECG). An ECG records your heartbeat patterns over a 24-hour period. You may also have other tests, such as:  Transthoracic echocardiogram (TTE). During echocardiography, sound waves are used to evaluate how blood flows through your heart.  Transesophageal echocardiogram (TEE).  Cardiac monitoring. This allows your health care provider to monitor your heart rate and rhythm in real time.  Holter monitor. This is a portable device that records your heartbeat and can help diagnose heart arrhythmias. It allows your health care provider to track your heart activity for several days, if needed.  Stress tests by exercise or by giving medicine that makes the heart beat faster. TREATMENT   Treatment depends on what may be causing your chest pain. Treatment may include:  Acid blockers for  heartburn.  Anti-inflammatory medicine.  Pain medicine for inflammatory conditions.  Antibiotics if an infection is present.  You may be advised to change lifestyle habits. This includes stopping smoking and avoiding alcohol, caffeine, and chocolate.  You may be advised to keep your head raised (elevated) when sleeping. This reduces the chance of acid going backward from your stomach into your esophagus. Most of the time, nonspecific chest pain will improve within 2-3 days with rest and mild pain medicine.  HOME CARE INSTRUCTIONS   If antibiotics were prescribed, take them as directed. Finish them even if you start to feel better.  For the next few days, avoid physical activities that bring on chest pain. Continue physical activities as directed.  Do not use any tobacco products, including cigarettes, chewing tobacco, or electronic cigarettes.  Avoid drinking alcohol.  Only take medicine as directed by your health care provider.  Follow your health care provider's suggestions for further testing if your chest pain does not go away.  Keep any follow-up appointments you made. If you do not go to an appointment, you could develop lasting (chronic) problems with pain. If there is any problem keeping an appointment, call to reschedule. SEEK MEDICAL CARE IF:   Your chest pain does not go away, even after treatment.  You have a rash with blisters on your chest.  You have a fever. SEEK IMMEDIATE MEDICAL CARE IF:   You have increased chest pain or pain that spreads to your arm, neck, jaw, back, or abdomen.  You have shortness of breath.  You have an increasing cough, or you cough  up blood.  You have severe back or abdominal pain.  You feel nauseous or vomit.  You have severe weakness.  You faint.  You have chills. This is an emergency. Do not wait to see if the pain will go away. Get medical help at once. Call your local emergency services (911 in U.[place] Do not drive  yourself to the hospital. MAKE SURE YOU:   Understand these instructions.  Will watch your condition.  Will get help right away if you are not doing well or get worse. Document Released: 05/06/2005 Document Revised: 08/01/2013 Document Reviewed: 03/01/2008 [facility] Patient Information 2015 [place], [place]. This information is not intended to replace advice given to you by your health care provider. Make sure you discuss any questions you have with your health care provider.  Stress and Stress Management Stress is a normal reaction to life events. It is what you feel when life demands more than you are used to or more than you can handle. Some stress can be useful. For example, the stress reaction can help you catch the last bus of the day, study for a test, or meet a deadline at work. But stress that occurs too often or for too long can cause problems. It can affect your emotional health and interfere with relationships and normal daily activities. Too much stress can weaken your immune system and increase your risk for physical illness. If you already have a medical problem, stress can make it worse. CAUSES  All sorts of life events may cause stress. An event that causes stress for one person may not be stressful for another person. Major life events commonly cause stress. These may be positive or negative. Examples include losing your job, moving into a new home, getting married, having a baby, or losing a loved one. Less obvious life events may also cause stress, especially if they occur day after day or in combination. Examples include working long hours, driving in traffic, caring for children, being in debt, or being in a difficult relationship. SIGNS AND SYMPTOMS Stress may cause emotional symptoms including, the following:  Anxiety--This is feeling worried, afraid, on edge, overwhelmed, or out of control.  Anger--This is feeling irritated or impatient.  Depression--This is feeling sad,  down, helpless, or guilty.  Difficulty focusing, remembering, or making decisions. Stress may cause physical symptoms, including the following:   Aches and pains--These may affect your head, neck, back, stomach, or other areas of your body.  Tight muscles or clenched jaw.  Low energy or trouble sleeping. Stress may cause unhealthy behaviors, including the following:   Eating to feel better (overeating) or skipping meals.  Sleeping too little, too much, or both.  Working too much or putting off tasks (procrastination).  Smoking, drinking alcohol, or using drugs to feel better. DIAGNOSIS  Stress is diagnosed through an assessment by your health care provider. Your health care provider will ask questions about your symptoms and any stressful life events.Your health care provider will also ask about your medical history and may order blood tests or other tests. Certain medical conditions and medicine can cause physical symptoms similar to stress. Mental illness can cause emotional symptoms and unhealthy behaviors similar to stress. Your health care provider may refer you to a mental health professional for further evaluation.  TREATMENT  Stress management is the recommended treatment for stress.The goals of stress management are reducing stressful life events and coping with stress in healthy ways.  Techniques for reducing stressful life events include the  following:  Stress identification--Self-monitor for stress and identify what causes stress for you. These skills may help you to avoid some stressful events.  Time management--Set your priorities, keep a calendar of events, and learn to say "no." These tools can help you avoid making too many commitments. Techniques for coping with stress include the following:  Rethinking the problem--Try to think realistically about stressful events rather than ignoring them or overreacting. Try to find the positives in a stressful situation rather  than focusing on the negatives.  Exercise--Physical exercise can release both physical and emotional tension. The key is to find a form of exercise you enjoy and do it regularly.  Relaxation techniques. These relax the body and mind. Examples include yoga, meditation, tai chi, biofeedback, deep breathing, progressive muscle relaxation, listening to music, being out in nature, journaling, and other hobbies. Again, the key is to find one or more that you enjoy and can do regularly.  Healthy lifestyle--Eat a balanced diet, get plenty of sleep, and do not smoke. Avoid using alcohol or drugs to relax.  Strong support network--Spend time with family, friends, or other people you enjoy being around.Express your feelings and talk things over with someone you trust. Counseling or talktherapy with a mental health professional may be helpful if you are having difficulty managing stress on your own. Medicine is typically not recommended for the treatment of stress.Talk to your health care provider if you think you need medicine for symptoms of stress. HOME CARE INSTRUCTIONS:  Keep all follow up appointments with your health care provider.  Only take any prescribed medicines as directed by your health care provider.  Talk to your health care provider before starting any new prescription or over-the-counter medicines. SEEK MEDICAL CARE IF:  Your symptoms get worse or you start having new symptoms.  You feel overwhelmed by your problems and can no longer manage them on your own. SEEK IMMEDIATE MEDICAL CARE IF:  You feel like hurting yourself or someone else. Document Released: 01/20/2001 Document Revised: 08/01/2013 Document Reviewed: 03/21/2013 [facility] Patient Information 2015 [place], [place]. This information is not intended to replace advice given to you by your health care provider. Make sure you discuss any questions you have with your health care provider.   Emergency Department Resource  Guide 1) Find a Doctor and Pay Out of Pocket Although you won't have to find out who is covered by your insurance plan, it is a good idea to ask around and get recommendations. You will then need to call the office and see if the doctor you have chosen will accept you as a new patient and what types of options they offer for patients who are self-pay. Some doctors offer discounts or will set up payment plans for their patients who do not have insurance, but you will need to ask so you aren't surprised when you get to your appointment.  2) Contact Your Local Health Department Not all health departments have doctors that can see patients for sick visits, but many do, so it is worth a call to see if yours does. If you don't know where your local health department is, you can check in your phone book. The CDC also has a tool to help you locate your state's health department, and many state websites also have listings of all of their local health departments.  3) Find a [facility] If your illness is not likely to be very severe or complicated, you may want to try a walk in clinic.  These are popping up all over the country in pharmacies, drugstores, and shopping centers. They're usually staffed by nurse practitioners or physician assistants that have been trained to treat common illnesses and complaints. They're usually fairly quick and inexpensive. However, if you have serious medical issues or chronic medical problems, these are probably not your best option.  No Primary Care Doctor: - Call Health Connect at  [phone] - they can help you locate a primary care doctor that  accepts your insurance, provides certain services, etc. - Physician Referral Service- [phone]  Chronic Pain Problems: Organization         Address  Phone   Notes  [facility]  [phone] Patients need to be referred by their primary care doctor.   Medication Assistance: Organization          Address  Phone   Notes  [facility] Medication [facility] [address] [place], [place] [address] [phone] --Must be a resident of [facility] -- Must have NO insurance coverage whatsoever (no Medicaid/ Medicare, etc.) -- The pt. MUST have a primary care doctor that directs their care regularly and follows them in the community   MedAssist  [phone]   [employer]  [phone]    Agencies that provide inexpensive medical care: Organization         Address  Phone   Notes  [place]  [phone]   [facility]    [phone]   [facility] [place],  [address] [phone]   [place] [address] [place] [phone]   Planned Parenthood    [phone]   [facility]    [phone]   [place] and [place], [place] Phone:  [phone], Fax:  [phone] Hours of Operation:  9 am - 6 pm, M-F.  Also accepts Medicaid/Medicare and self-pay.  [facility] for [place], [address], [place] Phone: [phone], Fax: [phone]. Hours of Operation:  8:30 am - 5:30 pm, M-F.  Also accepts Medicaid and self-pay.  [facility] [place] [address], [place] Phone: [phone]   [place], [place], [place] [phone], Ext. [phone] Mondays & Thursdays: 7-9 AM.  First 15 patients are seen on a first come, first serve basis.    [place] Providers:  Organization         Address  Phone   Notes  [facility] [address], [place] [phone] Also accepts self-pay patients.  [facility] [address], [place]  [phone]   [place], [address], [place] [phone]   [facility] [address] [place] [phone]   Lucianne Lei [address], [place]   [phone] Only accepts [place] Access [place] patients after they have their name applied to their card.   Self-Pay (no insurance) in [facility]:  Organization         Address  Phone   Notes  Sickle Cell Patients, [facility] [place] [phone]   [facility] [place] [phone]   [facility]  [address]  [address], [place] [phone]   [facility]/Dr. Osei-Bonsu  [address], [place] or [address], [place] [phone] Phone number for both [place] and [place] locations is the same.  Urgent Medical and [facility] [address] [place] [phone]   [facility] [address] [place] or [address] [phone]   [facility] [address], [place] [phone], phone; [phone], fax Sees patients 1st and 3rd Saturday of every month.  Must not qualify for public or private insurance (i.e. Medicaid, Medicare, [place] Health Choice, Veterans' Benefits)  Household income should be no more than 200% of the poverty level The clinic cannot treat you if you are pregnant or think you are pregnant  Sexually transmitted diseases are not treated at the clinic.    Dental Care: Organization         Address  Phone  Notes  [facility] Department of [facility] [place] [phone] Accepts children up to age 50 who are enrolled in [place] or [place]; pregnant women with a Medicaid card; and children who have applied for Medicaid or [place] Health Choice, but were declined, whose parents can pay a reduced fee at time of service.  [facility]  [address], [place] [phone] Accepts children up to age 68 who are enrolled in [place] or [place]; pregnant women with a Medicaid card; and children who have applied for Medicaid or [place] Health Choice, but were declined, whose parents can pay a reduced fee at time of service.  [facility] Access PRO[facility]  [place] [phone] Patients are seen by appointment only. Walk-ins are not accepted. [place] will see patients 54 years of age and older. Monday - Tuesday (8am-5pm) Most Wednesdays (8:30-5pm) $30 per visit, cash only  [facility] Adult [facility] Access PRO[facility]  [address], [facility] [phone] Patients are seen by appointment only. Walk-ins are not accepted. [place] will see patients 67 years of age and older. One Wednesday Evening (Monthly: Volunteer Based).  $30 per visit, cash only  [place]  [phone] for adults; Children under age 2, call Graduate Pediatric Dentistry at [phone]. Children aged 53-14, please call [phone] to request a pediatric application.  Dental services are provided in all areas of dental care including fillings, crowns and bridges, complete and partial dentures, implants, gum treatment, root canals, and extractions. Preventive care is also provided. Treatment is provided to both adults and children. Patients are selected via a lottery and there is often a waiting list.   [facility] [address] [place]  [phone] [email or web address]   [facility] [address] [place], [place] [phone], Ext. [phone] Second and Fourth Thursday of each month, opens at 6:30 AM; Clinic ends at 9 AM.  Patients are seen on a first-come first-served basis, and a limited number are seen during each clinic.   [facility]  [address] Hillard Danker [place], [place] [phone]   Eligibility Requirements You must  have lived in [place], [place], or [place] counties for at least the last three months.   You cannot be eligible for state or federal sponsored [employer], including [employer]  Administration, Medicaid, or Medicare.   You generally cannot be eligible for healthcare insurance through your employer.    How to apply: Eligibility screenings are held every Tuesday and Wednesday afternoon from 1:00 pm until 4:00 pm. You do not need an appointment for the interview!  [facility] [address], [place], [place]   [place]  [place] Department  [place]  [phone]    Behavioral Health Resources in the Community: Intensive Outpatient Programs Organization         Address  Phone  Notes  [place], [place], [place] [phone]   [facility] Outpatient [address] [place], [place]   ADS: Alcohol & Drug Svcs [address], [place], [place]   [place] [address],  [place], [place] or [phone]   Substance Abuse Resources Organization         Address  Phone  Notes  Alcohol and Drug Services  [phone]   [place]  [phone]   [facility] [place]   Chinita Pester  [phone]   Residential & Outpatient Substance Abuse Program  [phone]   Psychological Services Organization         Address  Phone  Notes  [facility] [place]  [place]  [phone]   [place] [address] [place] or [phone]    Mobile Crisis Teams Organization         Address  Phone  Notes  [facility], Mobile Crisis Care Unit  [phone]   [facility]  [address] [place], [place]   [place] [address] [place] [phone]    Self-Help/Support Groups Organization         Address  Phone             Notes  [place]. of [place] - variety of support groups  [place] Call for more information  Narcotics Anonymous (NA), [facility] [address], [employer] [place]  2 meetings at this location   Special educational needs teacher         Address  Phone  Notes  [facility]AP Residential Treatment [facility] [place],    [place]  [phone]   [facility]  [address], [place] [address], [place], [place]   [place], [place] [phone] Admissions: 8am-3pm M-F  Incentives Substance [place] [address]    [place], [place] [phone]   [facility] [address] [place], [place], [place]   [facility] [address]  [place], [place]   [facility] Programs - Intensive Outpatient [address], [place], [place]   [facility] [address]  [place], [place] [phone] or [phone]   [facility]idential Treatment Services (RTS) [address], [place], [place] Accepts Medicaid  [facility] [place] [address]  [place] [phone] Substance Abuse/Addiction Treatment   [facility] Organization         Address  Phone  Notes  [facility]  [phone]   Domenic Schwab, PhD [address] [place], [place]   [phone] or [phone]  [facility]   [address] [place], [place] [phone]   [address], [place], [place] [phone] Insurance/Medicaid/sponsorship through [facility] and Families [address], [address] [place], [place] [phone] [place], [place] [phone]    Dr. Adele Schilder  [phone]   [facility] Dept. 1) [address] [place] 2) [place] 3)  [address], [place] [phone]  [phone]   [place] [phone] or [phone] (After Hours)

## 2014-02-02 NOTE — ED Notes (Signed)
Patient c/o left chest tightness. Repeat EKG completed. EKG given to Dr Loretha StaplerWofford.

## 2014-02-02 NOTE — ED Notes (Signed)
Patient transported to X-ray 

## 2014-02-02 NOTE — ED Provider Notes (Signed)
CSN: [id number]     Arrival date & time 02/02/14  0244 History   First MD Initiated Contact with Patient 02/02/14 0448     Chief Complaint  Patient presents with  . Shortness of Breath     (Consider location/radiation/quality/duration/timing/severity/associated sxs/prior Treatment) Patient is a 23 y.o. female presenting with shortness of breath.  Shortness of Breath Severity:  Severe Onset quality:  Sudden Duration: Several hours ago. Timing:  Constant Progression:  Waxing and waning Chronicity:  Recurrent Context comment:  Previously, these symptoms have presented during times of stress. 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 Relieved by: Deep breathing techniques, attempts at relaxation. Worsened by:  Stress Ineffective treatments:  None tried Associated symptoms: chest pain (Tightness)   Associated symptoms: no cough, no diaphoresis, no fever and no vomiting   Associated symptoms comment:  Palpitations - heart racing   History reviewed. No pertinent past medical history. History reviewed. No pertinent past surgical history. History reviewed. No pertinent family history. History  Substance Use Topics  . Smoking status: Never Smoker   . Smokeless tobacco: Not on file  . Alcohol Use: Yes     Comment: occ   OB History   Grav Para Term Preterm Abortions TAB SAB Ect Mult Living                 Review of Systems  Constitutional: Negative for fever and diaphoresis.  Respiratory: Positive for shortness of breath. Negative for cough.   Cardiovascular: Positive for chest pain (Tightness).  Gastrointestinal: Negative for vomiting.  All other systems reviewed and are negative.     Allergies  Review of patient's allergies indicates no known allergies.  Home Medications   Prior to Admission medications   Medication Sig Start Date End Date Taking? Authorizing Provider   naproxen sodium (ANAPROX) 220 MG tablet Take 220 mg by mouth 2 (two) times daily as needed (pain).   Yes Historical Provider, MD  metroNIDAZOLE (FLAGYL) 500 MG tablet Take 1 tablet (500 mg total) by mouth 2 (two) times daily. 01/25/14   Sherren MochaEva N Shaw, MD   BP 117/52  Pulse 83  Temp(Src) 99 F (37.2 C) (Oral)  Resp 14  SpO2 100%  LMP 01/18/2014 Physical Exam  Nursing note and vitals reviewed. Constitutional: She is oriented to person, place, and time. She appears well-developed and well-nourished. No distress.  HENT:  Head: Normocephalic and atraumatic.  Mouth/Throat: Oropharynx is clear and moist.  Eyes: Conjunctivae are normal. Pupils are equal, round, and reactive to light. No scleral icterus.  Neck: Neck supple.  Cardiovascular: Regular rhythm, normal heart sounds and intact distal pulses.  Tachycardia present.   No murmur heard. Pulmonary/Chest: Effort normal and breath sounds normal. No stridor. No respiratory distress. She has no rales.  Abdominal: Soft. Bowel sounds are normal. She exhibits no distension. There is no tenderness.  Musculoskeletal: Normal range of motion.  Neurological: She is alert and oriented to person, place, and time.  Skin: Skin is warm and dry. No rash noted.  Psychiatric: She has a normal mood and affect. Her behavior is normal.    ED Course  Procedures (including critical care time) Labs Review Labs Reviewed  BASIC METABOLIC PANEL - Abnormal; Notable for the following:    Glucose, Bld 117 (*)    All other components within normal limits  CBC  PRO B NATRIURETIC PEPTIDE  D-DIMER, QUANTITATIVE  Rose[employer] SensorI-STAT TROPOININ, ED  POC URINE PREG, ED    Imaging Review Dg Chest 2 View  02/02/2014   CLINICAL DATA:  Shortness of breath and chest tightness.  EXAM: CHEST  2 VIEW  COMPARISON:  03/31/2010  FINDINGS: The heart size and mediastinal contours are within normal limits. Both lungs are clear. The visualized skeletal structures are unremarkable. Mild thoracic  curvature convex towards the right. No significant change since previous study.  IMPRESSION: No active cardiopulmonary disease.   Electronically Signed   By: Burman NievesWilliam  Stevens M.D.   On: 02/02/2014 06:01  All radiology studies independently viewed by me.      EKG Interpretation   Date/Time:  Friday February 02 2014 03:06:29 EDT Ventricular Rate:  103 PR Interval:  121 QRS Duration: 79 QT Interval:  334 QTC Calculation: 437 R Axis:   75 Text Interpretation:  Sinus rhythm Nonspecific T wave abnormality No old  tracing to compare Confirmed by [facility] PlazaWOFFORD  MD, TREY ([id number]) on 02/02/2014  3:13:50 AM      MDM   Final diagnoses:  Shortness of breath  Chest tightness  Stress    23 year old female presenting with shortness of breath, heart racing, chest tightness. She has experienced these symptoms in the past and believes them to be related to anxiety and stress. She is not sure what triggered this episode, but does admit to being under stress due to an upcoming test.  She has some nonspecific T-wave abnormalities on her EKG, with no prior for comparison. Her symptoms are not consistent with ACS and she is only 23 years old without ACS risk factors. PE unlikely but in differential. [facility]. However, d-dimer negative. Remainder of workup reassuring. Symptoms possibly secondary to anxiety. Advised relaxation techniques and PCP followup.    Candyce ChurnJohn David Wofford III, MD 02/02/14 [phone]

## 2014-02-02 NOTE — ED Notes (Signed)
Pt states tonight around 11pm she woke up feeling short of breath  Pt states she tried to go back to sleep but was unable to  Pt states about 1130 she felt like her heart was beating too fast and her chest started to feel tight  Pt states her fingers on her left hand started to hurt   Pt states she is currently taking flagyl for a bacterial infection

## 2014-02-02 NOTE — ED Notes (Signed)
MD notified patient appears very anxious. Updated him that patient's heart rate will increase rapidly. Patient able to be calmed and her rate will return to 100-105 bpm.

## 2015-06-04 IMAGING — CR DG CHEST 2V
2 series · 2 of 2 positions shown · non-contrast
Comparison: 03/31/2010

CLINICAL DATA: Shortness of breath and chest tightness.

EXAM:
CHEST  2 VIEW

[w chest pa]
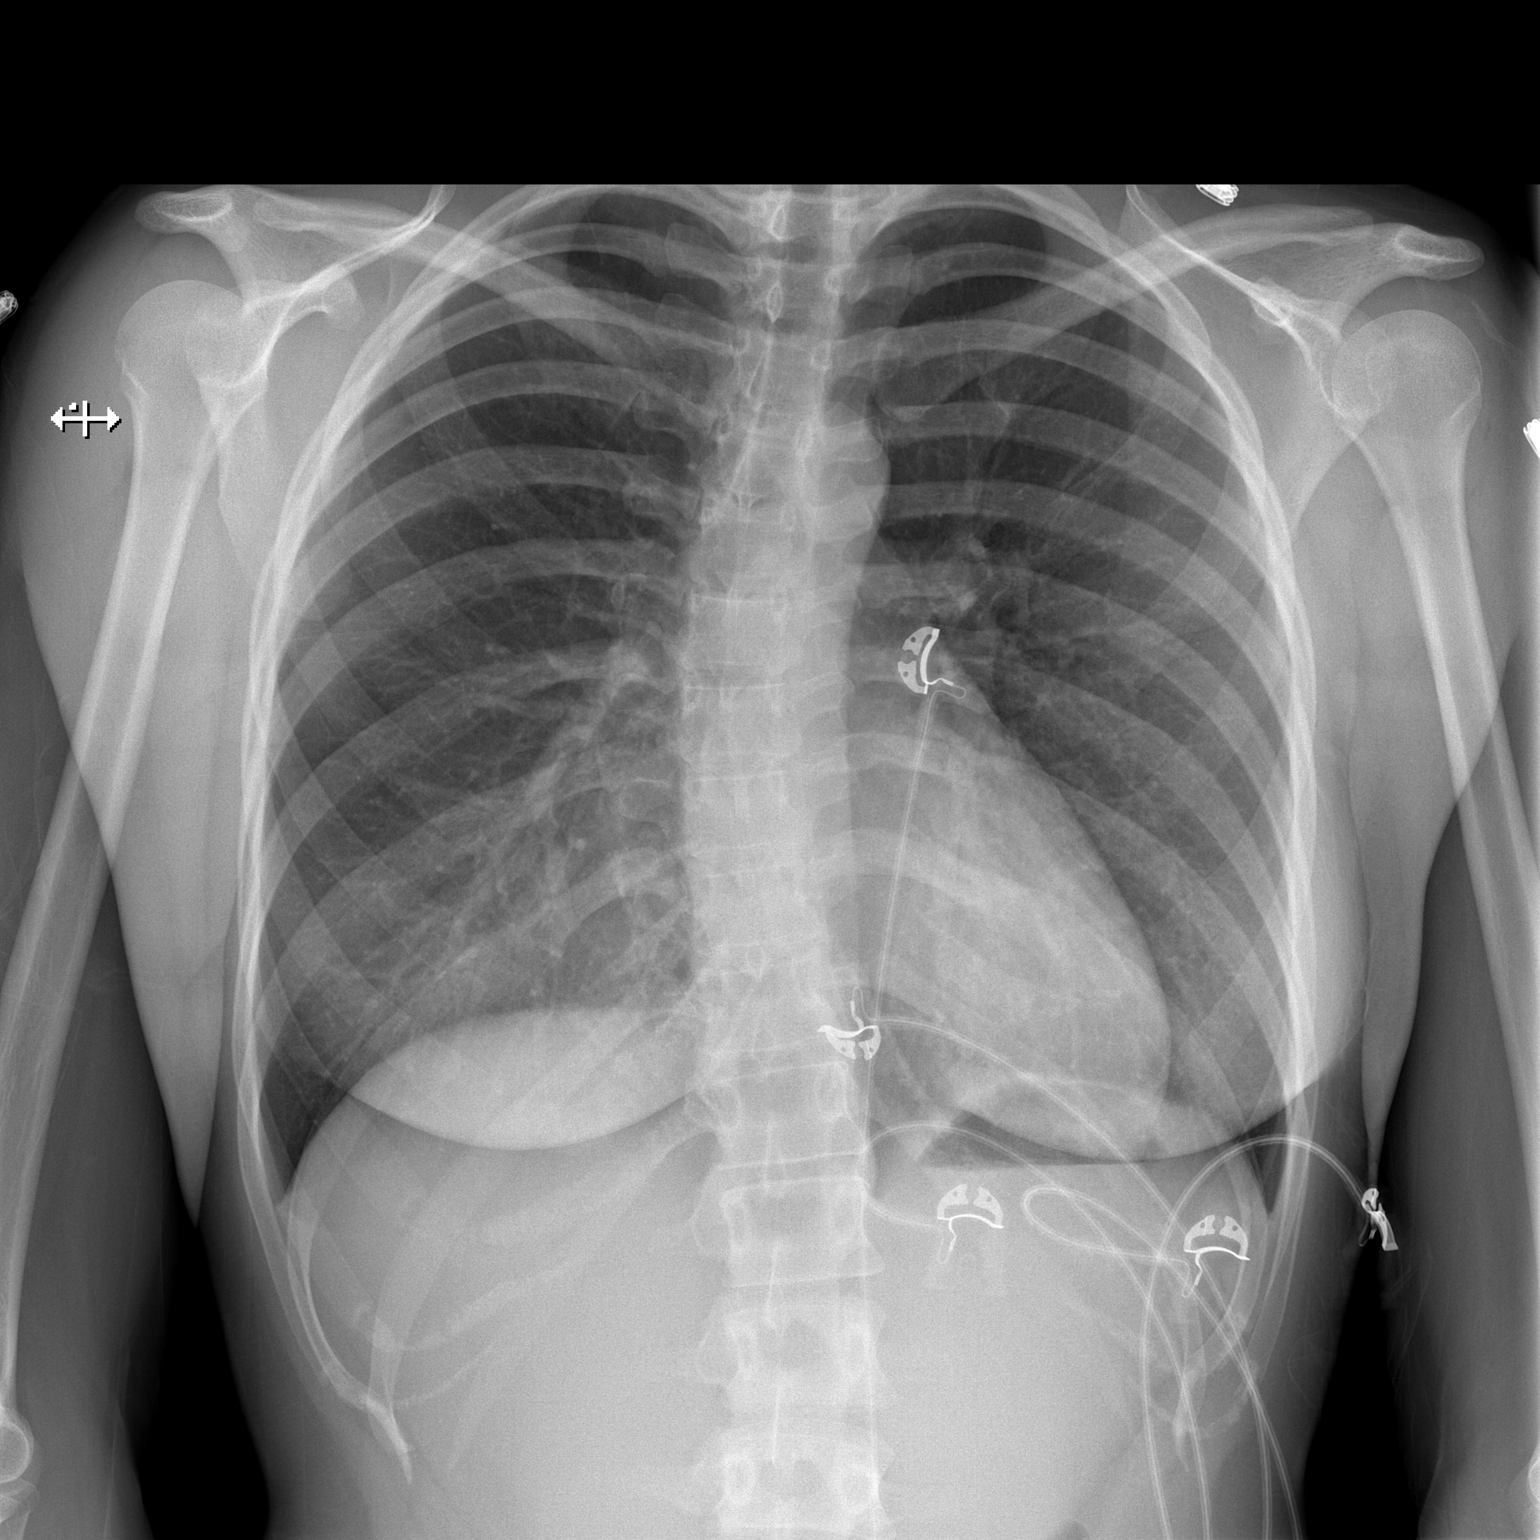

[w chest lat]
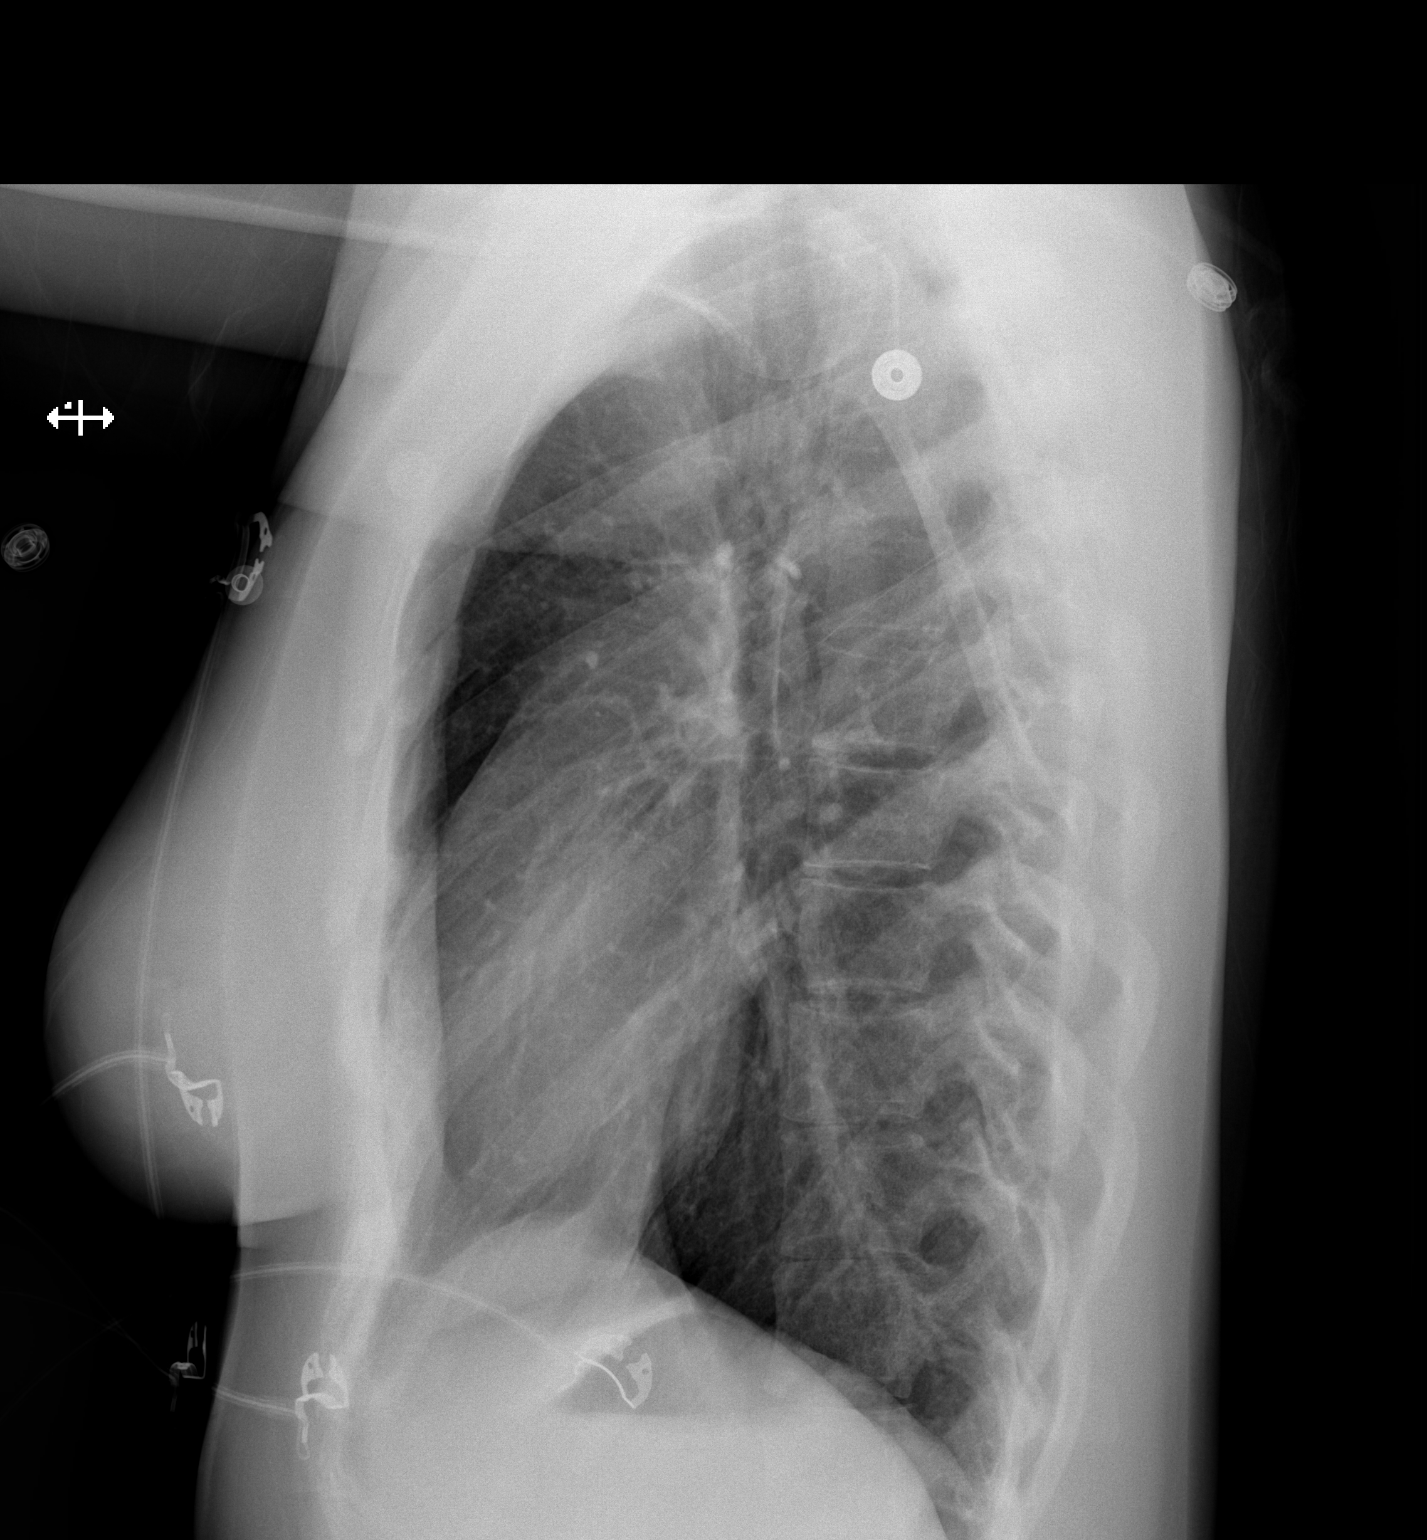

[2 of 2 positions shown; findings below may reference images not displayed]

FINDINGS: The heart size and mediastinal contours are within normal limits.
Both lungs are clear. The visualized skeletal structures are
unremarkable. Mild thoracic curvature convex towards the right. No
significant change since previous study.
IMPRESSION: No active cardiopulmonary disease.
